# Patient Record
Sex: Female | Born: 1960 | Race: Black or African American | Hispanic: No | Marital: Married | Smoking: Never smoker
Health system: Southern US, Community
[De-identification: ages and names within clinical notes are randomized; demographics above are authoritative.]

## PROBLEM LIST (undated history)

## (undated) DIAGNOSIS — I872 Venous insufficiency (chronic) (peripheral): Secondary | ICD-10-CM

## (undated) DIAGNOSIS — I10 Essential (primary) hypertension: Secondary | ICD-10-CM

## (undated) DIAGNOSIS — K5901 Slow transit constipation: Secondary | ICD-10-CM

## (undated) DIAGNOSIS — K649 Unspecified hemorrhoids: Secondary | ICD-10-CM

## (undated) DIAGNOSIS — E669 Obesity, unspecified: Secondary | ICD-10-CM

## (undated) DIAGNOSIS — N6489 Other specified disorders of breast: Secondary | ICD-10-CM

## (undated) DIAGNOSIS — G5622 Lesion of ulnar nerve, left upper limb: Secondary | ICD-10-CM

## (undated) DIAGNOSIS — R05 Cough: Secondary | ICD-10-CM

## (undated) DIAGNOSIS — R059 Cough, unspecified: Secondary | ICD-10-CM

## (undated) DIAGNOSIS — I8393 Asymptomatic varicose veins of bilateral lower extremities: Secondary | ICD-10-CM

## (undated) HISTORY — DX: Slow transit constipation: K59.01

## (undated) HISTORY — DX: Unspecified hemorrhoids: K64.9

## (undated) HISTORY — DX: Asymptomatic varicose veins of bilateral lower extremities: I83.93

## (undated) HISTORY — PX: TUBAL LIGATION: SHX77

## (undated) HISTORY — DX: Obesity, unspecified: E66.9

## (undated) HISTORY — DX: Cough: R05

## (undated) HISTORY — DX: Venous insufficiency (chronic) (peripheral): I87.2

## (undated) HISTORY — DX: Lesion of ulnar nerve, left upper limb: G56.22

## (undated) HISTORY — DX: Essential (primary) hypertension: I10

## (undated) HISTORY — DX: Other specified disorders of breast: N64.89

## (undated) HISTORY — DX: Cough, unspecified: R05.9

---

## 1998-06-01 ENCOUNTER — Other Ambulatory Visit: Admission: RE | Admit: 1998-06-01 | Discharge: 1998-06-01 | Payer: Self-pay | Admitting: *Deleted

## 1999-10-15 ENCOUNTER — Other Ambulatory Visit: Admission: RE | Admit: 1999-10-15 | Discharge: 1999-10-15 | Payer: Self-pay | Admitting: Obstetrics and Gynecology

## 2001-05-29 ENCOUNTER — Other Ambulatory Visit: Admission: RE | Admit: 2001-05-29 | Discharge: 2001-05-29 | Payer: Self-pay | Admitting: Obstetrics and Gynecology

## 2002-06-14 ENCOUNTER — Other Ambulatory Visit: Admission: RE | Admit: 2002-06-14 | Discharge: 2002-06-14 | Payer: Self-pay | Admitting: Obstetrics and Gynecology

## 2004-03-13 ENCOUNTER — Other Ambulatory Visit: Admission: RE | Admit: 2004-03-13 | Discharge: 2004-03-13 | Payer: Self-pay | Admitting: Obstetrics and Gynecology

## 2004-04-17 ENCOUNTER — Ambulatory Visit (HOSPITAL_COMMUNITY): Admission: RE | Admit: 2004-04-17 | Discharge: 2004-04-17 | Payer: Self-pay | Admitting: Family Medicine

## 2005-06-19 ENCOUNTER — Other Ambulatory Visit: Admission: RE | Admit: 2005-06-19 | Discharge: 2005-06-19 | Payer: Self-pay | Admitting: Obstetrics and Gynecology

## 2006-09-10 ENCOUNTER — Other Ambulatory Visit: Admission: RE | Admit: 2006-09-10 | Discharge: 2006-09-10 | Payer: Self-pay | Admitting: Family Medicine

## 2008-04-11 ENCOUNTER — Other Ambulatory Visit: Admission: RE | Admit: 2008-04-11 | Discharge: 2008-04-11 | Payer: Self-pay | Admitting: Family Medicine

## 2009-05-30 ENCOUNTER — Other Ambulatory Visit: Admission: RE | Admit: 2009-05-30 | Discharge: 2009-05-30 | Payer: Self-pay | Admitting: Family Medicine

## 2009-10-19 ENCOUNTER — Encounter: Admission: RE | Admit: 2009-10-19 | Discharge: 2009-10-19 | Payer: Self-pay | Admitting: *Deleted

## 2010-06-05 ENCOUNTER — Other Ambulatory Visit: Admission: RE | Admit: 2010-06-05 | Discharge: 2010-06-05 | Payer: Self-pay | Admitting: Family Medicine

## 2011-06-10 ENCOUNTER — Other Ambulatory Visit: Payer: Self-pay

## 2011-06-10 ENCOUNTER — Other Ambulatory Visit (HOSPITAL_COMMUNITY)
Admission: RE | Admit: 2011-06-10 | Discharge: 2011-06-10 | Disposition: A | Discharge status: Home / Self Care | Payer: Self-pay | Source: Ambulatory Visit | Attending: Family Medicine | Admitting: Family Medicine

## 2011-06-10 DIAGNOSIS — Z01419 Encounter for gynecological examination (general) (routine) without abnormal findings: Secondary | ICD-10-CM | POA: Insufficient documentation

## 2012-07-21 ENCOUNTER — Other Ambulatory Visit (HOSPITAL_COMMUNITY)
Admission: RE | Admit: 2012-07-21 | Discharge: 2012-07-21 | Disposition: A | Discharge status: Home / Self Care | Payer: BC Managed Care – PPO | Source: Ambulatory Visit | Attending: Family Medicine | Admitting: Family Medicine

## 2012-07-21 ENCOUNTER — Other Ambulatory Visit: Payer: Self-pay | Admitting: Family Medicine

## 2012-07-21 DIAGNOSIS — Z0000 Encounter for general adult medical examination without abnormal findings: Secondary | ICD-10-CM | POA: Insufficient documentation

## 2013-07-23 ENCOUNTER — Other Ambulatory Visit: Payer: Self-pay | Admitting: Family Medicine

## 2013-07-23 ENCOUNTER — Other Ambulatory Visit (HOSPITAL_COMMUNITY)
Admission: RE | Admit: 2013-07-23 | Discharge: 2013-07-23 | Disposition: A | Discharge status: Home / Self Care | Payer: BC Managed Care – PPO | Source: Ambulatory Visit | Attending: Family Medicine | Admitting: Family Medicine

## 2013-07-23 DIAGNOSIS — Z0000 Encounter for general adult medical examination without abnormal findings: Secondary | ICD-10-CM | POA: Diagnosis not present

## 2014-11-24 ENCOUNTER — Other Ambulatory Visit: Payer: Self-pay | Admitting: Gastroenterology

## 2016-08-15 ENCOUNTER — Other Ambulatory Visit: Payer: Self-pay

## 2016-08-15 ENCOUNTER — Other Ambulatory Visit (HOSPITAL_COMMUNITY)
Admission: RE | Admit: 2016-08-15 | Discharge: 2016-08-15 | Disposition: A | Discharge status: Home / Self Care | Payer: BLUE CROSS/BLUE SHIELD | Source: Ambulatory Visit | Attending: Pediatrics | Admitting: Pediatrics

## 2016-08-15 DIAGNOSIS — Z01419 Encounter for gynecological examination (general) (routine) without abnormal findings: Secondary | ICD-10-CM | POA: Diagnosis not present

## 2016-08-16 LAB — CYTOLOGY - PAP: DIAGNOSIS: NEGATIVE

## 2017-06-13 DIAGNOSIS — R05 Cough: Secondary | ICD-10-CM | POA: Diagnosis not present

## 2017-06-13 DIAGNOSIS — J029 Acute pharyngitis, unspecified: Secondary | ICD-10-CM | POA: Diagnosis not present

## 2017-07-16 DIAGNOSIS — K5901 Slow transit constipation: Secondary | ICD-10-CM | POA: Diagnosis not present

## 2017-07-16 DIAGNOSIS — E669 Obesity, unspecified: Secondary | ICD-10-CM | POA: Diagnosis not present

## 2017-07-16 DIAGNOSIS — I10 Essential (primary) hypertension: Secondary | ICD-10-CM | POA: Diagnosis not present

## 2017-07-16 DIAGNOSIS — Z23 Encounter for immunization: Secondary | ICD-10-CM | POA: Diagnosis not present

## 2017-09-15 DIAGNOSIS — Z23 Encounter for immunization: Secondary | ICD-10-CM | POA: Diagnosis not present

## 2017-09-15 DIAGNOSIS — Z0000 Encounter for general adult medical examination without abnormal findings: Secondary | ICD-10-CM | POA: Diagnosis not present

## 2017-10-21 DIAGNOSIS — Z1231 Encounter for screening mammogram for malignant neoplasm of breast: Secondary | ICD-10-CM | POA: Diagnosis not present

## 2017-10-31 DIAGNOSIS — I83813 Varicose veins of bilateral lower extremities with pain: Secondary | ICD-10-CM | POA: Diagnosis not present

## 2018-04-16 DIAGNOSIS — I10 Essential (primary) hypertension: Secondary | ICD-10-CM | POA: Diagnosis not present

## 2018-05-19 DIAGNOSIS — I10 Essential (primary) hypertension: Secondary | ICD-10-CM | POA: Diagnosis not present

## 2018-05-19 DIAGNOSIS — Z6831 Body mass index (BMI) 31.0-31.9, adult: Secondary | ICD-10-CM | POA: Diagnosis not present

## 2018-05-19 DIAGNOSIS — N6489 Other specified disorders of breast: Secondary | ICD-10-CM | POA: Diagnosis not present

## 2018-05-19 DIAGNOSIS — Z23 Encounter for immunization: Secondary | ICD-10-CM | POA: Diagnosis not present

## 2018-09-21 DIAGNOSIS — N6489 Other specified disorders of breast: Secondary | ICD-10-CM | POA: Diagnosis not present

## 2018-09-21 DIAGNOSIS — I10 Essential (primary) hypertension: Secondary | ICD-10-CM | POA: Diagnosis not present

## 2018-10-06 ENCOUNTER — Other Ambulatory Visit: Payer: Self-pay

## 2018-10-06 DIAGNOSIS — I872 Venous insufficiency (chronic) (peripheral): Secondary | ICD-10-CM

## 2018-10-07 DIAGNOSIS — I10 Essential (primary) hypertension: Secondary | ICD-10-CM | POA: Diagnosis not present

## 2018-10-21 DIAGNOSIS — Z0000 Encounter for general adult medical examination without abnormal findings: Secondary | ICD-10-CM | POA: Diagnosis not present

## 2018-10-21 DIAGNOSIS — E669 Obesity, unspecified: Secondary | ICD-10-CM | POA: Diagnosis not present

## 2018-10-21 DIAGNOSIS — I83813 Varicose veins of bilateral lower extremities with pain: Secondary | ICD-10-CM | POA: Diagnosis not present

## 2018-10-21 DIAGNOSIS — I10 Essential (primary) hypertension: Secondary | ICD-10-CM | POA: Diagnosis not present

## 2018-10-26 DIAGNOSIS — Z1231 Encounter for screening mammogram for malignant neoplasm of breast: Secondary | ICD-10-CM | POA: Diagnosis not present

## 2018-11-03 DIAGNOSIS — J069 Acute upper respiratory infection, unspecified: Secondary | ICD-10-CM | POA: Diagnosis not present

## 2018-11-03 DIAGNOSIS — I83813 Varicose veins of bilateral lower extremities with pain: Secondary | ICD-10-CM | POA: Diagnosis not present

## 2018-11-03 DIAGNOSIS — I10 Essential (primary) hypertension: Secondary | ICD-10-CM | POA: Diagnosis not present

## 2018-11-11 ENCOUNTER — Encounter: Payer: Self-pay | Admitting: Vascular Surgery

## 2018-11-16 ENCOUNTER — Encounter: Payer: Self-pay | Admitting: Vascular Surgery

## 2018-11-17 ENCOUNTER — Encounter (HOSPITAL_COMMUNITY): Payer: BLUE CROSS/BLUE SHIELD

## 2018-11-17 ENCOUNTER — Encounter: Payer: BLUE CROSS/BLUE SHIELD | Admitting: Vascular Surgery

## 2019-01-12 ENCOUNTER — Ambulatory Visit (HOSPITAL_COMMUNITY)
Admission: RE | Admit: 2019-01-12 | Discharge: 2019-01-12 | Disposition: A | Discharge status: Home / Self Care | Payer: BLUE CROSS/BLUE SHIELD | Source: Ambulatory Visit | Attending: Vascular Surgery | Admitting: Vascular Surgery

## 2019-01-12 ENCOUNTER — Other Ambulatory Visit: Payer: Self-pay

## 2019-01-12 ENCOUNTER — Ambulatory Visit (INDEPENDENT_AMBULATORY_CARE_PROVIDER_SITE_OTHER): Payer: BLUE CROSS/BLUE SHIELD | Admitting: Vascular Surgery

## 2019-01-12 ENCOUNTER — Encounter: Payer: Self-pay | Admitting: Vascular Surgery

## 2019-01-12 VITALS — BP 126/72 | HR 102 | Temp 97.9°F | Resp 14 | Ht 64.0 in | Wt 177.8 lb

## 2019-01-12 DIAGNOSIS — M25561 Pain in right knee: Secondary | ICD-10-CM | POA: Diagnosis not present

## 2019-01-12 DIAGNOSIS — I872 Venous insufficiency (chronic) (peripheral): Secondary | ICD-10-CM | POA: Insufficient documentation

## 2019-01-12 NOTE — Progress Notes (Signed)
[facility] [place]  Patient name: Morgan Mcintyre MRN: [id number] DOB: [birth year] Sex: female  REASON FOR CONSULT: Discuss pain in her right leg  HPI: Morgan Mcintyre is a 58 y.o. female, who is here today for discussion of pain in her right leg.  She is been told in the past that this may be venous hypertension.  She denies any right or left leg swelling.  She reports tenderness and pain over her pretibial area on the right.  This can occur at any time and is not related to exercise.  She reports that she has worn compression at night with some improvement in her symptoms.  No history of DVT and no history of venous varicosities.  Does have a history of a few telangiectasia on her thighs  Past Medical History:  Diagnosis Date  . Breast asymmetry   . Cough   . Cubital tunnel syndrome, left   . Hemorrhoids   . Hypertension   . Obesity   . Slow transit constipation   . Varicose veins of both lower extremities   . Venous (peripheral) insufficiency     History reviewed. No pertinent family history.  SOCIAL HISTORY: Social History   Socioeconomic History  . Marital status: Married    Spouse name: Not on file  . Number of children: 2  . Years of education: Not on file  . Highest education level: Not on file  Occupational History  . Not on file  Social Needs  . Financial resource strain: Not on file  . Food insecurity:    Worry: Not on file    Inability: Not on file  . Transportation needs:    Medical: Not on file    Non-medical: Not on file  Tobacco Use  . Smoking status: Never Smoker  . Smokeless tobacco: Never Used  Substance and Sexual Activity  . Alcohol use: Not on file  . Drug use: Not on file  . Sexual activity: Not on file  Lifestyle  . Physical activity:    Days per week: Not on file    Minutes per session: Not on file  . Stress: Not on file  Relationships  . Social connections:    Talks on  phone: Not on file    Gets together: Not on file    Attends religious service: Not on file    Active member of club or organization: Not on file    Attends meetings of clubs or organizations: Not on file    Relationship status: Not on file  . Intimate partner violence:    Fear of current or ex partner: Not on file    Emotionally abused: Not on file    Physically abused: Not on file    Forced sexual activity: Not on file  Other Topics Concern  . Not on file  Social History Narrative  . Not on file    Allergies  Allergen Reactions  . Benazepril     Angioedema     Current Outpatient Medications  Medication Sig Dispense Refill  . amLODipine (NORVASC) 10 MG tablet     . cholecalciferol (VITAMIN D3) 25 MCG (1000 UT) tablet Take 1,000 Units by mouth daily.    . cycloSPORINE (RESTASIS) 0.05 % ophthalmic emulsion 1 drop 2 (two) times daily. 1 drop into affected eye Ophthalmic    . hydrochlorothiazide (HYDRODIURIL) 25 MG tablet     . Hydrocortisone Acetate (ANUSOL HC-1 EX) Apply topically.    Marland Kitchen  Multiple Vitamin (MULTIVITAMIN) tablet Take 1 tablet by mouth daily.    . Probiotic Product (PROBIOTIC-10 ULTIMATE PO) Take by mouth.     No current facility-administered medications for this visit.     REVIEW OF SYSTEMS:  [X]  denotes positive finding, [ ]  denotes negative finding Cardiac  Comments:  Chest pain or chest pressure:    Shortness of breath upon exertion:    Short of breath when lying flat:    Irregular heart rhythm:        Vascular    Pain in calf, thigh, or hip brought on by ambulation: x   Pain in feet at night that wakes you up from your sleep:     Blood clot in your veins:    Leg swelling:         Pulmonary    Oxygen at home:    Productive cough:     Wheezing:         Neurologic    Sudden weakness in arms or legs:     Sudden numbness in arms or legs:     Sudden onset of difficulty speaking or slurred speech:    Temporary loss of vision in one eye:      Problems with dizziness:         Gastrointestinal    Blood in stool:     Vomited blood:         Genitourinary    Burning when urinating:     Blood in urine:        Psychiatric    Major depression:         Hematologic    Bleeding problems:    Problems with blood clotting too easily:        Skin    Rashes or ulcers:        Constitutional    Fever or chills:      PHYSICAL EXAM: Vitals:   01/12/19 1256  BP: 126/72  Pulse: (!) 102  Resp: 14  Temp: 97.9 F (36.6 C)  TempSrc: Oral  SpO2: 98%  Weight: 177 lb 12.8 oz (80.6 kg)  Height: 5\' 4"  (1.626 m)    GENERAL: The patient is a well-nourished female, in no acute distress. The vital signs are documented above. CARDIOVASCULAR: 2+ radial and 2+ dorsalis pedis pulses bilaterally.  No evidence of venous varicosities.  Few scattered telangiectasia on her medial and lateral thighs PULMONARY: There is good air exchange  ABDOMEN: Soft and non-tender  MUSCULOSKELETAL: There are no major deformities or cyanosis. NEUROLOGIC: No focal weakness or paresthesias are detected. SKIN: There are no ulcers or rashes noted.  No evidence of venous hypertension with hemosiderin or other issues PSYCHIATRIC: The patient has a normal affect.  DATA:  Lower extremity duplex shows no evidence of DVT and no evidence of significant venous reflux  MEDICAL ISSUES: I discussed these findings with the patient.  I am not sure as to the cause of her discomfort.  I do not see any evidence of physical findings for venous hypertension and certainly does not have any evidence of arterial insufficiency.  Her main issue is pain over the pretibial area that appears to be more musculoskeletal.  She was reassured assured with this discussion and will see us again on an as-needed basis   Larina Earthlyodd F. Daivd Fredericksen, MD [facility] Office Tel [phone] Pager [phone]

## 2019-01-28 DIAGNOSIS — I10 Essential (primary) hypertension: Secondary | ICD-10-CM | POA: Diagnosis not present

## 2019-01-28 DIAGNOSIS — E669 Obesity, unspecified: Secondary | ICD-10-CM | POA: Diagnosis not present

## 2019-02-05 DIAGNOSIS — H6121 Impacted cerumen, right ear: Secondary | ICD-10-CM | POA: Diagnosis not present

## 2019-02-05 DIAGNOSIS — H9311 Tinnitus, right ear: Secondary | ICD-10-CM | POA: Diagnosis not present

## 2019-02-10 DIAGNOSIS — H9311 Tinnitus, right ear: Secondary | ICD-10-CM | POA: Diagnosis not present

## 2019-03-16 DIAGNOSIS — H6983 Other specified disorders of Eustachian tube, bilateral: Secondary | ICD-10-CM | POA: Diagnosis not present

## 2019-03-16 DIAGNOSIS — H9313 Tinnitus, bilateral: Secondary | ICD-10-CM | POA: Diagnosis not present

## 2019-03-16 DIAGNOSIS — H93293 Other abnormal auditory perceptions, bilateral: Secondary | ICD-10-CM | POA: Diagnosis not present

## 2019-04-27 DIAGNOSIS — Z23 Encounter for immunization: Secondary | ICD-10-CM | POA: Diagnosis not present

## 2019-05-03 DIAGNOSIS — I10 Essential (primary) hypertension: Secondary | ICD-10-CM | POA: Diagnosis not present

## 2019-09-27 ENCOUNTER — Ambulatory Visit: Payer: BLUE CROSS/BLUE SHIELD | Attending: Internal Medicine

## 2019-09-30 ENCOUNTER — Ambulatory Visit: Payer: BLUE CROSS/BLUE SHIELD

## 2019-10-04 ENCOUNTER — Ambulatory Visit: Payer: BLUE CROSS/BLUE SHIELD | Attending: Family

## 2019-10-04 DIAGNOSIS — Z23 Encounter for immunization: Secondary | ICD-10-CM | POA: Insufficient documentation

## 2019-10-04 NOTE — Progress Notes (Signed)
   Covid-19 Vaccination Clinic  Name:  Morgan Mcintyre    MRN: [id number] DOB: Nov 20, 1960  10/04/2019  [name] was observed post Covid-19 immunization for 15 minutes without incidence. She was provided with Vaccine Information Sheet and instruction to access the V-Safe system.   [name] was instructed to call 911 with any severe reactions post vaccine: Marland Kitchen Difficulty breathing  . Swelling of your face and throat  . A fast heartbeat  . A bad rash all over your body  . Dizziness and weakness    Immunizations Administered    Name Date Dose VIS Date Route   Moderna COVID-19 Vaccine 10/04/2019  2:12 PM 0.5 mL 07/13/2019 Intramuscular   Manufacturer: Moderna   Lot: [id number]   NDC: 63016-010-93

## 2019-11-01 DIAGNOSIS — K5901 Slow transit constipation: Secondary | ICD-10-CM | POA: Diagnosis not present

## 2019-11-01 DIAGNOSIS — I10 Essential (primary) hypertension: Secondary | ICD-10-CM | POA: Diagnosis not present

## 2019-11-01 DIAGNOSIS — Z0000 Encounter for general adult medical examination without abnormal findings: Secondary | ICD-10-CM | POA: Diagnosis not present

## 2019-11-08 ENCOUNTER — Other Ambulatory Visit: Payer: Self-pay | Admitting: Family Medicine

## 2019-11-08 DIAGNOSIS — E7800 Pure hypercholesterolemia, unspecified: Secondary | ICD-10-CM

## 2019-11-09 ENCOUNTER — Ambulatory Visit: Payer: BLUE CROSS/BLUE SHIELD | Attending: Family

## 2019-11-09 DIAGNOSIS — Z23 Encounter for immunization: Secondary | ICD-10-CM

## 2019-11-09 NOTE — Progress Notes (Signed)
   Covid-19 Vaccination Clinic  Name:  Morgan Mcintyre    MRN: [id number] DOB: [birth year]  11/09/2019  Ms. Morgan Mcintyre was observed post Covid-19 immunization for 15 minutes without incident. She was provided with Vaccine Information Sheet and instruction to access the V-Safe system.   Ms. Morgan Mcintyre was instructed to call 911 with any severe reactions post vaccine: Marland Kitchen Difficulty breathing  . Swelling of face and throat  . A fast heartbeat  . A bad rash all over body  . Dizziness and weakness   Immunizations Administered    Name Date Dose VIS Date Route   Moderna COVID-19 Vaccine 11/09/2019  3:55 PM 0.5 mL 07/13/2019 Intramuscular   Manufacturer: Moderna   Lot: [id number]   NDC: 09030-149-96

## 2019-11-26 ENCOUNTER — Other Ambulatory Visit: Payer: BLUE CROSS/BLUE SHIELD

## 2019-12-17 ENCOUNTER — Ambulatory Visit
Admission: RE | Admit: 2019-12-17 | Discharge: 2019-12-17 | Disposition: A | Discharge status: Home / Self Care | Payer: No Typology Code available for payment source | Source: Ambulatory Visit | Attending: Family Medicine | Admitting: Family Medicine

## 2019-12-17 DIAGNOSIS — E7800 Pure hypercholesterolemia, unspecified: Secondary | ICD-10-CM

## 2019-12-24 DIAGNOSIS — Z1231 Encounter for screening mammogram for malignant neoplasm of breast: Secondary | ICD-10-CM | POA: Diagnosis not present

## 2020-01-03 DIAGNOSIS — H25813 Combined forms of age-related cataract, bilateral: Secondary | ICD-10-CM | POA: Diagnosis not present

## 2020-01-03 DIAGNOSIS — H18513 Endothelial corneal dystrophy, bilateral: Secondary | ICD-10-CM | POA: Diagnosis not present

## 2020-01-03 DIAGNOSIS — H16223 Keratoconjunctivitis sicca, not specified as Sjogren's, bilateral: Secondary | ICD-10-CM | POA: Diagnosis not present

## 2020-02-17 DIAGNOSIS — H2512 Age-related nuclear cataract, left eye: Secondary | ICD-10-CM | POA: Diagnosis not present

## 2020-02-28 DIAGNOSIS — H2511 Age-related nuclear cataract, right eye: Secondary | ICD-10-CM | POA: Diagnosis not present

## 2020-03-02 DIAGNOSIS — H2511 Age-related nuclear cataract, right eye: Secondary | ICD-10-CM | POA: Diagnosis not present

## 2020-08-22 ENCOUNTER — Other Ambulatory Visit: Payer: BLUE CROSS/BLUE SHIELD

## 2020-08-25 ENCOUNTER — Other Ambulatory Visit: Payer: BLUE CROSS/BLUE SHIELD

## 2021-10-24 ENCOUNTER — Encounter (HOSPITAL_BASED_OUTPATIENT_CLINIC_OR_DEPARTMENT_OTHER): Payer: Self-pay | Admitting: *Deleted

## 2021-10-24 ENCOUNTER — Other Ambulatory Visit: Payer: Self-pay

## 2021-10-29 ENCOUNTER — Encounter (HOSPITAL_BASED_OUTPATIENT_CLINIC_OR_DEPARTMENT_OTHER)
Admission: RE | Admit: 2021-10-29 | Discharge: 2021-10-29 | Disposition: A | Discharge status: Home / Self Care | Payer: BC Managed Care – PPO | Source: Ambulatory Visit | Attending: Ophthalmology | Admitting: Ophthalmology

## 2021-10-29 DIAGNOSIS — Z01812 Encounter for preprocedural laboratory examination: Secondary | ICD-10-CM | POA: Insufficient documentation

## 2021-10-29 DIAGNOSIS — I10 Essential (primary) hypertension: Secondary | ICD-10-CM | POA: Diagnosis not present

## 2021-10-29 DIAGNOSIS — H5010 Unspecified exotropia: Secondary | ICD-10-CM | POA: Diagnosis present

## 2021-10-29 LAB — BASIC METABOLIC PANEL
Anion gap: 8 (ref 5–15)
BUN: 11 mg/dL (ref 8–23)
CO2: 27 mmol/L (ref 22–32)
Calcium: 9.9 mg/dL (ref 8.9–10.3)
Chloride: 103 mmol/L (ref 98–111)
Creatinine, Ser: 0.64 mg/dL (ref 0.44–1.00)
GFR, Estimated: >60 mL/min (ref >60)
Glucose, Bld: 95 mg/dL (ref 70–99)
Potassium: 4.2 mmol/L (ref 3.5–5.1)
Sodium: 138 mmol/L (ref 135–145)

## 2021-10-31 ENCOUNTER — Ambulatory Visit: Payer: Self-pay | Admitting: Ophthalmology

## 2021-10-31 NOTE — H&P (Signed)
?  Date of examination:  10/24/21 ? 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 ?Pertinent past medical history:  ?Past Medical History:  ?Diagnosis Date  ? Breast asymmetry   ? Cough   ? Cubital tunnel syndrome, left   ? Hemorrhoids   ? Hypertension   ? Obesity   ? Slow transit constipation   ? Varicose veins of both lower extremities   ? Venous (peripheral) insufficiency   ? ? ?Pertinent ocular history:  Patient reports years of intermittnet outward turning of her right eye that is becoming more and more frequent. She notices it during conversation and is also quite bothered by the social implications of not making eye contact. Patient has TORIC IOLS in both eyes with good visual results. ? ?Pertinent family history: No family history on file. ? ?General:  Healthy appearing patient in no distress.   ? ?Eyes:   ? Acuity OD 20/25+  OS 20/20-  [place] ? External: Within normal limits    ? Anterior segment: Within normal limits    ? Motility:   30pd RX(T) grade 4 with small V pattern ? ?Impression:60yo female with RX(T) disrupting binocular vision and social communication ability. ? ?Plan: Eye muscle surgery both eyes ? ?M. Lenox Ahr, MD ? ?

## 2021-10-31 NOTE — H&P (View-Only) (Signed)
?  Date of examination:  10/24/21 ? 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 ?Pertinent past medical history:  ?Past Medical History:  ?Diagnosis Date  ? Breast asymmetry   ? Cough   ? Cubital tunnel syndrome, left   ? Hemorrhoids   ? Hypertension   ? Obesity   ? Slow transit constipation   ? Varicose veins of both lower extremities   ? Venous (peripheral) insufficiency   ? ? ?Pertinent ocular history:  Patient reports years of intermittnet outward turning of her right eye that is becoming more and more frequent. She notices it during conversation and is also quite bothered by the social implications of not making eye contact. Patient has TORIC IOLS in both eyes with good visual results. ? ?Pertinent family history: No family history on file. ? ?General:  Healthy appearing patient in no distress.   ? ?Eyes:   ? Acuity OD 20/25+  OS 20/20-  [place] ? External: Within normal limits    ? Anterior segment: Within normal limits    ? Motility:   30pd RX(T) grade 4 with small V pattern ? ?Impression:60yo female with RX(T) disrupting binocular vision and social communication ability. ? ?Plan: Eye muscle surgery both eyes ? ?M. Lenox Ahr, MD ? ?

## 2021-11-01 ENCOUNTER — Ambulatory Visit (HOSPITAL_BASED_OUTPATIENT_CLINIC_OR_DEPARTMENT_OTHER): Payer: BC Managed Care – PPO | Admitting: Anesthesiology

## 2021-11-01 ENCOUNTER — Encounter (HOSPITAL_BASED_OUTPATIENT_CLINIC_OR_DEPARTMENT_OTHER): Payer: Self-pay | Admitting: Ophthalmology

## 2021-11-01 ENCOUNTER — Ambulatory Visit (HOSPITAL_BASED_OUTPATIENT_CLINIC_OR_DEPARTMENT_OTHER)
Admission: RE | Admit: 2021-11-01 | Discharge: 2021-11-01 | Disposition: A | Discharge status: Home / Self Care | Payer: BC Managed Care – PPO | Attending: Ophthalmology | Admitting: Ophthalmology

## 2021-11-01 ENCOUNTER — Encounter (HOSPITAL_BASED_OUTPATIENT_CLINIC_OR_DEPARTMENT_OTHER)
Admission: RE | Disposition: A | Discharge status: Home / Self Care | Payer: Self-pay | Source: Home / Self Care | Attending: Ophthalmology

## 2021-11-01 ENCOUNTER — Other Ambulatory Visit: Payer: Self-pay

## 2021-11-01 DIAGNOSIS — Z79899 Other long term (current) drug therapy: Secondary | ICD-10-CM

## 2021-11-01 DIAGNOSIS — H5010 Unspecified exotropia: Secondary | ICD-10-CM | POA: Diagnosis not present

## 2021-11-01 DIAGNOSIS — I10 Essential (primary) hypertension: Secondary | ICD-10-CM | POA: Insufficient documentation

## 2021-11-01 HISTORY — PX: STRABISMUS SURGERY: SHX218

## 2021-11-01 SURGERY — STRABISMUS SURGERY, BILATERAL
Anesthesia: General | Site: Eye | Laterality: Bilateral

## 2021-11-01 MED ORDER — ONDANSETRON HCL 4 MG/2ML IJ SOLN
INTRAMUSCULAR | Status: DC | PRN
  Administered 2021-11-01: 4 mg via INTRAVENOUS

## 2021-11-01 MED ORDER — BUPIVACAINE HCL (PF) 0.5 % IJ SOLN
INTRAMUSCULAR | Status: AC
  Filled 2021-11-01: qty 30

## 2021-11-01 MED ORDER — PHENYLEPHRINE HCL 2.5 % OP SOLN
OPHTHALMIC | Status: AC
  Filled 2021-11-01: qty 2

## 2021-11-01 MED ORDER — LIDOCAINE HCL (CARDIAC) PF 100 MG/5ML IV SOSY
PREFILLED_SYRINGE | INTRAVENOUS | Status: DC | PRN
  Administered 2021-11-01: 60 mg via INTRATRACHEAL

## 2021-11-01 MED ORDER — LACTATED RINGERS IV SOLN: INTRAVENOUS | Status: DC

## 2021-11-01 MED ORDER — TOBRAMYCIN-DEXAMETHASONE 0.3-0.1 % OP SUSP: 1.0000 [drp] | Freq: Four times a day (QID) | OPHTHALMIC | 0 refills | Status: AC

## 2021-11-01 MED ORDER — PHENYLEPHRINE HCL (PRESSORS) 10 MG/ML IV SOLN
INTRAVENOUS | Status: DC | PRN
  Administered 2021-11-01 (×3): 80 ug via INTRAVENOUS

## 2021-11-01 MED ORDER — ONDANSETRON HCL 4 MG/2ML IJ SOLN
INTRAMUSCULAR | Status: AC
  Filled 2021-11-01: qty 2

## 2021-11-01 MED ORDER — TOBRAMYCIN-DEXAMETHASONE 0.3-0.1 % OP SUSP
OPHTHALMIC | Status: AC
  Filled 2021-11-01: qty 2.5

## 2021-11-01 MED ORDER — ONDANSETRON HCL 4 MG/2ML IJ SOLN: 4.0000 mg | Freq: Once | INTRAMUSCULAR | Status: DC | PRN

## 2021-11-01 MED ORDER — MIDAZOLAM HCL 5 MG/5ML IJ SOLN
INTRAMUSCULAR | Status: DC | PRN
  Administered 2021-11-01: 2 mg via INTRAVENOUS

## 2021-11-01 MED ORDER — FENTANYL CITRATE (PF) 100 MCG/2ML IJ SOLN: 25.0000 ug | INTRAMUSCULAR | Status: DC | PRN

## 2021-11-01 MED ORDER — MIDAZOLAM HCL 2 MG/2ML IJ SOLN
INTRAMUSCULAR | Status: AC
  Filled 2021-11-01: qty 2

## 2021-11-01 MED ORDER — TOBRAMYCIN-DEXAMETHASONE 0.3-0.1 % OP SUSP
OPHTHALMIC | Status: DC | PRN
Start: 2021-11-01 — End: 2021-11-01
  Administered 2021-11-01: 2 [drp] via OPHTHALMIC

## 2021-11-01 MED ORDER — FENTANYL CITRATE (PF) 100 MCG/2ML IJ SOLN
INTRAMUSCULAR | Status: AC
  Filled 2021-11-01: qty 2

## 2021-11-01 MED ORDER — ACETAMINOPHEN 325 MG PO TABS: 325.0000 mg | ORAL_TABLET | ORAL | Status: DC | PRN

## 2021-11-01 MED ORDER — ACETAMINOPHEN 160 MG/5ML PO SOLN: 325.0000 mg | ORAL | Status: DC | PRN

## 2021-11-01 MED ORDER — KETOROLAC TROMETHAMINE 30 MG/ML IJ SOLN
INTRAMUSCULAR | Status: DC | PRN
  Administered 2021-11-01: 30 mg via INTRAVENOUS

## 2021-11-01 MED ORDER — EPHEDRINE SULFATE (PRESSORS) 50 MG/ML IJ SOLN
INTRAMUSCULAR | Status: DC | PRN
  Administered 2021-11-01: 10 mg via INTRAVENOUS

## 2021-11-01 MED ORDER — OXYCODONE HCL 5 MG PO TABS: 5.0000 mg | ORAL_TABLET | Freq: Once | ORAL | Status: DC | PRN

## 2021-11-01 MED ORDER — BSS IO SOLN
INTRAOCULAR | Status: AC
  Filled 2021-11-01: qty 15

## 2021-11-01 MED ORDER — BSS IO SOLN
INTRAOCULAR | Status: DC | PRN
  Administered 2021-11-01: 15 mL

## 2021-11-01 MED ORDER — BUPIVACAINE HCL (PF) 0.5 % IJ SOLN
INTRAMUSCULAR | Status: DC | PRN
  Administered 2021-11-01: 3 mL

## 2021-11-01 MED ORDER — NEOMYCIN-POLYMYXIN-DEXAMETH 3.5-10000-0.1 OP OINT
TOPICAL_OINTMENT | OPHTHALMIC | Status: DC | PRN
  Administered 2021-11-01: 1 via OPHTHALMIC

## 2021-11-01 MED ORDER — OXYCODONE HCL 5 MG/5ML PO SOLN: 5.0000 mg | Freq: Once | ORAL | Status: DC | PRN

## 2021-11-01 MED ORDER — CYCLOSPORINE 0.05 % OP EMUL
1.0000 [drp] | Freq: Two times a day (BID) | OPHTHALMIC | 1 refills | Status: AC
Start: 2021-11-07 — End: ?

## 2021-11-01 MED ORDER — DEXAMETHASONE SODIUM PHOSPHATE 10 MG/ML IJ SOLN
INTRAMUSCULAR | Status: DC | PRN
  Administered 2021-11-01: 10 mg via INTRAVENOUS

## 2021-11-01 MED ORDER — PROPOFOL 10 MG/ML IV BOLUS
INTRAVENOUS | Status: AC
  Filled 2021-11-01: qty 20

## 2021-11-01 MED ORDER — KETOROLAC TROMETHAMINE 30 MG/ML IJ SOLN
INTRAMUSCULAR | Status: AC
  Filled 2021-11-01: qty 1

## 2021-11-01 MED ORDER — ACETAMINOPHEN 500 MG PO TABS
1000.0000 mg | ORAL_TABLET | Freq: Once | ORAL | Status: AC
  Administered 2021-11-01: 1000 mg via ORAL

## 2021-11-01 MED ORDER — PROPOFOL 10 MG/ML IV BOLUS
INTRAVENOUS | Status: DC | PRN
  Administered 2021-11-01: 140 mg via INTRAVENOUS
  Administered 2021-11-01: 20 mg via INTRAVENOUS

## 2021-11-01 MED ORDER — MEPERIDINE HCL 25 MG/ML IJ SOLN: 6.2500 mg | INTRAMUSCULAR | Status: DC | PRN

## 2021-11-01 MED ORDER — NEOMYCIN-POLYMYXIN-DEXAMETH 3.5-10000-0.1 OP OINT
TOPICAL_OINTMENT | OPHTHALMIC | Status: AC
  Filled 2021-11-01: qty 3.5

## 2021-11-01 MED ORDER — PHENYLEPHRINE HCL 2.5 % OP SOLN
1.0000 [drp] | Freq: Once | OPHTHALMIC | Status: AC
  Administered 2021-11-01: 1 [drp] via OPHTHALMIC

## 2021-11-01 MED ORDER — FENTANYL CITRATE (PF) 100 MCG/2ML IJ SOLN
INTRAMUSCULAR | Status: DC | PRN
  Administered 2021-11-01: 50 ug via INTRAVENOUS
  Administered 2021-11-01: 25 ug via INTRAVENOUS

## 2021-11-01 SURGICAL SUPPLY — 29 items
APL SRG 3 HI ABS STRL LF PLS (MISCELLANEOUS) ×1
APL SWBSTK 6 STRL LF DISP (MISCELLANEOUS) ×4
APPLICATOR COTTON TIP 6 STRL (MISCELLANEOUS) ×4 IMPLANT
APPLICATOR COTTON TIP 6IN STRL (MISCELLANEOUS) ×8
APPLICATOR DR MATTHEWS STRL (MISCELLANEOUS) ×2 IMPLANT
BNDG EYE OVAL (GAUZE/BANDAGES/DRESSINGS) IMPLANT
CAUTERY EYE LOW TEMP 1300F FIN (OPHTHALMIC RELATED) IMPLANT
CORD BIPOLAR FORCEPS 12FT (ELECTRODE) ×2 IMPLANT
COVER BACK TABLE 60X90IN (DRAPES) ×2 IMPLANT
COVER MAYO STAND STRL (DRAPES) ×2 IMPLANT
DRAPE SURG 17X23 STRL (DRAPES) IMPLANT
DRAPE U-SHAPE 76X120 STRL (DRAPES) ×2 IMPLANT
GLOVE SURG ENC MOIS LTX SZ6.5 (GLOVE) ×2 IMPLANT
GLOVE SURG ENC MOIS LTX SZ7 (GLOVE) ×2 IMPLANT
GOWN STRL REUS W/ TWL LRG LVL3 (GOWN DISPOSABLE) ×2 IMPLANT
GOWN STRL REUS W/TWL LRG LVL3 (GOWN DISPOSABLE) ×4
NS IRRIG 1000ML POUR BTL (IV SOLUTION) ×2 IMPLANT
PACK BASIN DAY SURGERY FS (CUSTOM PROCEDURE TRAY) ×2 IMPLANT
SHEILD EYE MED CORNL SHD 22X21 (OPHTHALMIC RELATED)
SHIELD EYE MED CORNL SHD 22X21 (OPHTHALMIC RELATED) IMPLANT
SPEAR EYE SURG WECK-CEL (MISCELLANEOUS) ×2 IMPLANT
STRIP CLOSURE SKIN 1/4X4 (GAUZE/BANDAGES/DRESSINGS) IMPLANT
SUT CHROMIC 7 0 TG140 8 (SUTURE) ×2 IMPLANT
SUT VICRYL 6 0 S 28 (SUTURE) ×2 IMPLANT
SUT VICRYL ABS 6-0 S29 18IN (SUTURE) IMPLANT
SYR 10ML LL (SYRINGE) ×2 IMPLANT
SYR 3ML 23GX1 SAFETY (SYRINGE) ×4 IMPLANT
TOWEL GREEN STERILE FF (TOWEL DISPOSABLE) ×2 IMPLANT
TRAY DSU PREP LF (CUSTOM PROCEDURE TRAY) ×2 IMPLANT

## 2021-11-01 NOTE — Op Note (Signed)
11/01/2021 ? ?9:28 AM ? ?PATIENT:  Morgan Mcintyre  61 y.o. female ? ?PRE-OPERATIVE DIAGNOSIS:  Exotropia with V-pattern ? ?POST-OPERATIVE DIAGNOSIS:  Exotropia with V-pattern ? ?PROCEDURE:  Lateral rectus muscle recession 54mm both with upshift ? ?SURGEON:  Annita Brod, M.D.  ? ?ANESTHESIA: General LMA and local subTenons bupivicaine ? ?COMPLICATIONS: None immediate ? ?DESCRIPTION OF PROCEDURE: The patient was taken to the operating room where She was identified by me. General anesthesia was induced without difficulty after placement of appropriate monitors. The patient was prepped and draped in the usual sterile ophthalmic fashion. Maxitrol eye ointment was placed in both eyes for corneal protection during the case. ? ?A lid speculum was placed in the right eye. Forced ductions were unremarkable. Through an inferotemporal fornix incision through conjunctiva and Tenon's fascia, the right lateral rectus muscle was engaged on a series of muscle hooks and cleared of its fascial attachments. The tendon was secured with a double-armed 6-0 Vicryl suture with a double locking bite at each border of the muscle, 1 mm from the insertion. The muscle was disinserted, and was reattached to sclera at a measured distance of 7 millimeters posterior to the original insertion, with 1/2 muscle-width upshift, using direct scleral passes in crossed swords fashion.  The suture ends were tied securely after the position of the muscle had been checked and found to be accurate. 1.67mL of bupivacaine 0.5% was diffused into the sub-Tenons space for perioperative anesthesia. Conjunctiva was closed with 2 7-0 Chromic sutures. ? ?The speculum was transferred to the left eye, where an identical procedure was performed, again effecting a 7 millimeters recession with 1/2 muscle-width upshift of the lateral rectus muscle. ? ?Two drops of dilute betadine were placed on each eye and rinsed after ten seconds; Maxitrol ointment was placed in each  eye. The patient was awakened without difficulty and taken to the recovery room in stable condition, having suffered no intraoperative or immediate postoperative complications. ? ?M. Lenox Ahr M.D. ? ?

## 2021-11-01 NOTE — Anesthesia Procedure Notes (Signed)
Procedure Name: LMA Insertion ?Date/Time: 11/01/2021 8:44 AM ?Performed by: Glory Buff, CRNA ?Pre-anesthesia Checklist: Patient identified, Emergency Drugs available, Suction available and Patient being monitored ?Patient Re-evaluated:Patient Re-evaluated prior to induction ?Oxygen Delivery Method: Circle system utilized ?Preoxygenation: Pre-oxygenation with 100% oxygen ?Induction Type: IV induction ?LMA: LMA inserted ?LMA Size: 4.0 ?Number of attempts: 1 ?Placement Confirmation: positive ETCO2 ?Tube secured with: Tape ?Dental Injury: Teeth and Oropharynx as per pre-operative assessment  ? ? ? ? ?

## 2021-11-01 NOTE — Anesthesia Preprocedure Evaluation (Addendum)
Anesthesia Evaluation  ?Patient identified by MRN, date of birth, ID band ?Patient awake ? ? ? ?Reviewed: ?Allergy & Precautions, NPO status , Patient's Chart, lab work & pertinent test results ? ?History of Anesthesia Complications ?Negative for: history of anesthetic complications ? ?Airway ?Mallampati: II ? ?TM Distance: >3 FB ?Neck ROM: Full ? ? ? Dental ? ?(+) Dental Advisory Given, Teeth Intact ?  ?Pulmonary ?neg pulmonary ROS,  ?  ?Pulmonary exam normal ? ? ? ? ? ? ? Cardiovascular ?hypertension, Pt. on medications ?Normal cardiovascular exam ? ? ?  ?Neuro/Psych ?negative neurological ROS ?   ? GI/Hepatic ?negative GI ROS, Neg liver ROS,   ?Endo/Other  ?negative endocrine ROS ? Renal/GU ?negative Renal ROS  ?negative genitourinary ?  ?Musculoskeletal ?negative musculoskeletal ROS ?(+)  ? Abdominal ?  ?Peds ? Hematology ?negative hematology ROS ?(+)   ?Anesthesia Other Findings ? ? Reproductive/Obstetrics ? ?  ? ? ? ? ? ? ? ? ? ? ? ? ? ?  ?  ? ? ? ? ? ? ?Anesthesia Physical ?Anesthesia Plan ? ?ASA: 2 ? ?Anesthesia Plan: General  ? ?Post-op Pain Management: Toradol IV (intra-op)* and Tylenol PO (pre-op)*  ? ?Induction: Intravenous ? ?PONV Risk Score and Plan: 3 and Ondansetron, Dexamethasone, Midazolam and Treatment may vary due to age or medical condition ? ?Airway Management Planned: LMA ? ?Additional Equipment: None ? ?Intra-op Plan:  ? ?Post-operative Plan: Extubation in OR ? ?Informed Consent: I have reviewed the patients History and Physical, chart, labs and discussed the procedure including the risks, benefits and alternatives for the proposed anesthesia with the patient or authorized representative who has indicated his/her understanding and acceptance.  ? ? ? ?Dental advisory given ? ?Plan Discussed with:  ? ?Anesthesia Plan Comments:   ? ? ? ? ? ?Anesthesia Quick Evaluation ? ?

## 2021-11-01 NOTE — Discharge Instructions (Addendum)
Diet: Clear liquids, advance to soft foods then regular diet as tolerated. ? ?Pain control:  ? 1)  Ibuprofen 600 mg by mouth every 6-8 hours as needed for pain ? 2)  Acetaminophen 325 one or two by mouth every 4-6 hours as needed for pain that is not resolved by ibuprofen; may alternate with ibuprofen every 2-3 hours for best results ? This regimen has been clinically studied and proven as effective against pain as morphine. ? ?Eye medications:   1. Antibiotic eye drops or ointment, one drop or application in the operated eye(s) 4 times a day for 7 days.   ?   2. Restasis:  stop for one week; restart on 11/07/21 ?   3. Muro eye drops: stop for two days; may restart on 11/04/21, if significant burning from unhealed wounds, may try to restart again on 11/06/21 ? ?Activity: No swimming for 1 week. It is OK to let water run over the face and eyes while showering or taking a bath, even during the first week.  No other restriction on exercise or activity. ? ?Eye movement: The eyes may look very slightly crossed in or turned out, and you may experience temporary diplopia. This is not unusual postoperatively and may happen up to two months after surgery while the muscles are healing. The eyes may be tired during the first few weeks after surgery; reading can be uncomfortable during the healing process but will not hurt the eyes. ? ?Call Dr. Eliane Decree office [phone] with any problems or concerns. ? ? ?No Tylenol until after 1:45pm today if needed ? ?Post Anesthesia Home Care Instructions ? ?Activity: ?Get plenty of rest for the remainder of the day. A responsible individual must stay with you for 24 hours following the procedure.  ?For the next 24 hours, DO NOT: ?-Drive a car ?-Advertising copywriter ?-Drink alcoholic beverages ?-Take any medication unless instructed by your physician ?-Make any legal decisions or sign important papers. ? ?Meals: ?Start with liquid foods such as gelatin or soup. Progress to regular foods as  tolerated. Avoid greasy, spicy, heavy foods. If nausea and/or vomiting occur, drink only clear liquids until the nausea and/or vomiting subsides. Call your physician if vomiting continues. ? ?Special Instructions/Symptoms: ?Your throat may feel dry or sore from the anesthesia or the breathing tube placed in your throat during surgery. If this causes discomfort, gargle with warm salt water. The discomfort should disappear within 24 hours. ? ?If you had a scopolamine patch placed behind your ear for the management of post- operative nausea and/or vomiting: ? ?1. The medication in the patch is effective for 72 hours, after which it should be removed.  Wrap patch in a tissue and discard in the trash. Wash hands thoroughly with soap and water. ?2. You may remove the patch earlier than 72 hours if you experience unpleasant side effects which may include dry mouth, dizziness or visual disturbances. ?3. Avoid touching the patch. Wash your hands with soap and water after contact with the patch. ?    ? ?

## 2021-11-01 NOTE — Anesthesia Postprocedure Evaluation (Signed)
Anesthesia Post Note ? ?Patient: Morgan Mcintyre ? ?Procedure(s) Performed: REPAIR STRABISMUS BILATERAL (Bilateral: Eye) ? ?  ? ?Patient location during evaluation: PACU ?Anesthesia Type: General ?Level of consciousness: awake and alert ?Pain management: pain level controlled ?Vital Signs Assessment: post-procedure vital signs reviewed and stable ?Respiratory status: spontaneous breathing, nonlabored ventilation and respiratory function stable ?Cardiovascular status: blood pressure returned to baseline and stable ?Postop Assessment: no apparent nausea or vomiting ?Anesthetic complications: no ? ? ?No notable events documented. ? ?Last Vitals:  ?Vitals:  ? 11/01/21 0934 11/01/21 0945  ?BP: 115/70 104/70  ?Pulse: 92 84  ?Resp: 17 14  ?Temp: 36.8 ?C   ?SpO2: 100% 100%  ?  ?Last Pain:  ?Vitals:  ? 11/01/21 1000  ?TempSrc:   ?PainSc: 0-No pain  ? ? ?  ?  ?  ?  ?  ?  ? ?Lucretia Kern ? ? ? ? ?

## 2021-11-01 NOTE — Interval H&P Note (Signed)
History and Physical Interval Note: ? ?11/01/2021 ?8:19 AM ? ?Morgan Mcintyre  has presented today for surgery, with the diagnosis of EXOTROPIA.  The various methods of treatment have been discussed with the patient and family. After consideration of risks, benefits and other options for treatment, the patient has consented to  Procedure(s): ?REPAIR STRABISMUS BILATERAL (Bilateral) as a surgical intervention.  The patient's history has been reviewed, patient examined, no change in status, stable for surgery.  I have reviewed the patient's chart and labs.  Questions were answered to the patient's satisfaction.   ? ? ?French Ana ? ? ?

## 2021-11-01 NOTE — Transfer of Care (Signed)
Immediate Anesthesia Transfer of Care Note ? ?Patient: Morgan Mcintyre ? ?Procedure(s) Performed: REPAIR STRABISMUS BILATERAL (Bilateral: Eye) ? ?Patient Location: PACU ? ?Anesthesia Type:General ? ?Level of Consciousness: drowsy, patient cooperative and responds to stimulation ? ?Airway & Oxygen Therapy: Patient Spontanous Breathing and Patient connected to face mask oxygen ? ?Post-op Assessment: Report given to RN and Post -op Vital signs reviewed and stable ? ?Post vital signs: Reviewed and stable ? ?Last Vitals:  ?Vitals Value Taken Time  ?BP    ?Temp    ?Pulse 92 11/01/21 0934  ?Resp 17 11/01/21 0934  ?SpO2 100 % 11/01/21 0934  ?Vitals shown include unvalidated device data. ? ?Last Pain:  ?Vitals:  ? 11/01/21 0721  ?TempSrc: Oral  ?PainSc: 0-No pain  ?   ? ?  ? ?Complications: No notable events documented. ?

## 2021-11-02 ENCOUNTER — Encounter (HOSPITAL_BASED_OUTPATIENT_CLINIC_OR_DEPARTMENT_OTHER): Payer: Self-pay | Admitting: Ophthalmology

## 2021-12-04 ENCOUNTER — Other Ambulatory Visit: Payer: Self-pay | Admitting: Family Medicine

## 2021-12-04 DIAGNOSIS — E7800 Pure hypercholesterolemia, unspecified: Secondary | ICD-10-CM

## 2022-01-02 ENCOUNTER — Ambulatory Visit
Admission: RE | Admit: 2022-01-02 | Discharge: 2022-01-02 | Disposition: A | Discharge status: Home / Self Care | Payer: No Typology Code available for payment source | Source: Ambulatory Visit | Attending: Family Medicine | Admitting: Family Medicine

## 2022-01-02 DIAGNOSIS — E7800 Pure hypercholesterolemia, unspecified: Secondary | ICD-10-CM

## 2022-08-05 IMAGING — CT CT CARDIAC CORONARY ARTERY CALCIUM SCORE
3 series · 12 of 20 positions shown, 14 images · non-contrast
Comparison: None Available.

CLINICAL DATA: Pure hypercholesterolemia

EXAM:
CT CARDIAC CORONARY ARTERY CALCIUM SCORE
TECHNIQUE: Non-contrast imaging through the heart was performed using
prospective ECG gating. Image post processing was performed on an
independent workstation, allowing for quantitative analysis of the
heart and coronary arteries. Note that this exam targets the heart
and the chest was not imaged in its entirety.

[Series 2: calcium scoring 2.00 qr36 bestdiast 71% hrt calciu · axial · 0.41mm/px · z∈[+1544,+1576]mm · 2 of 80 slices shown]
[im 16/80  vessel]
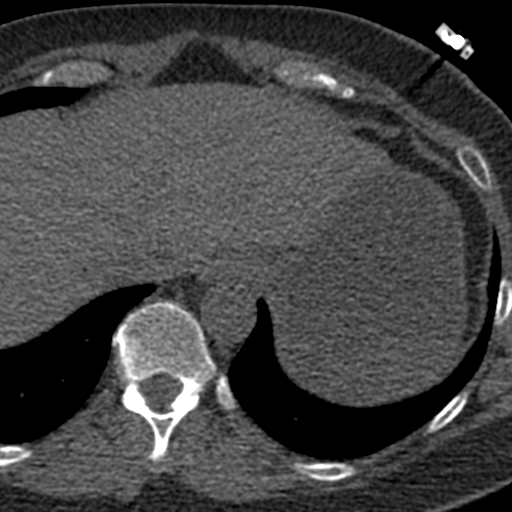
[im 32/80  vessel]
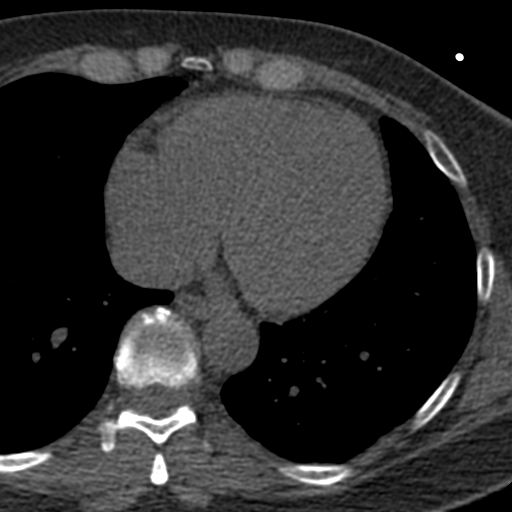

[Series 3: calcium scoring 2.00 br40 bestdiast 71% axial · axial · 0.50mm/px · z∈[+1540,+1644]mm · 5 of 80 slices shown, 7 images]
[im 14/80  vessel]
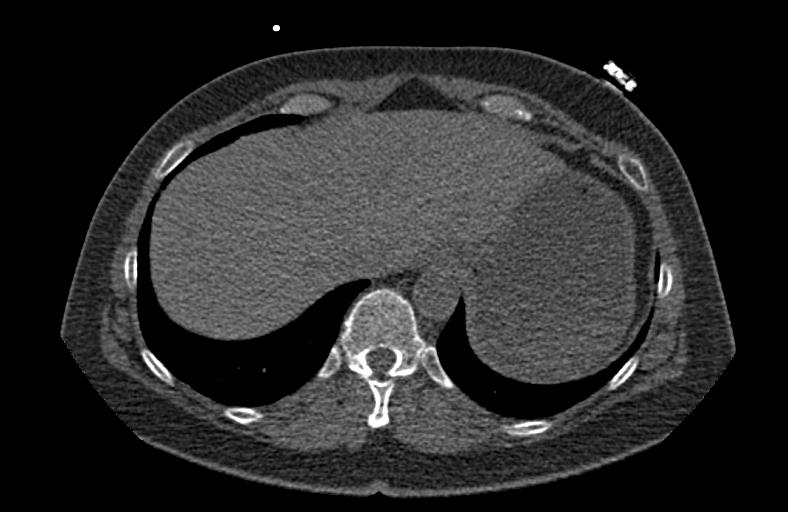
[im 14/80  lung]
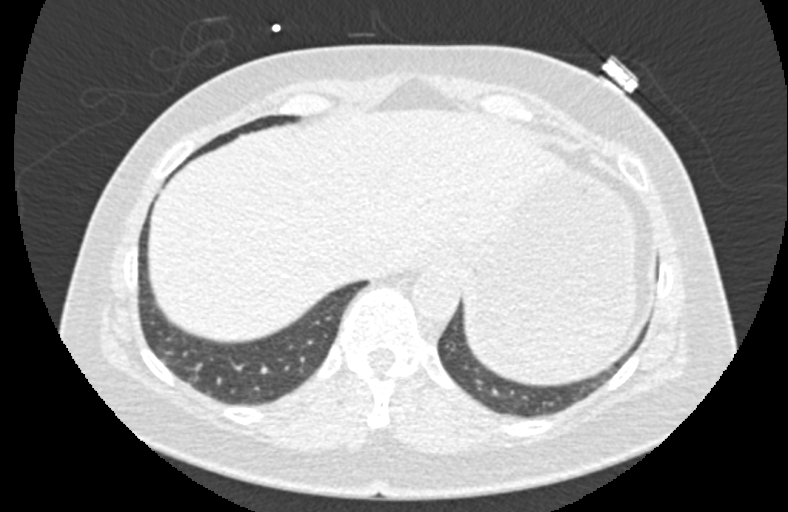
[im 27/80  vessel]
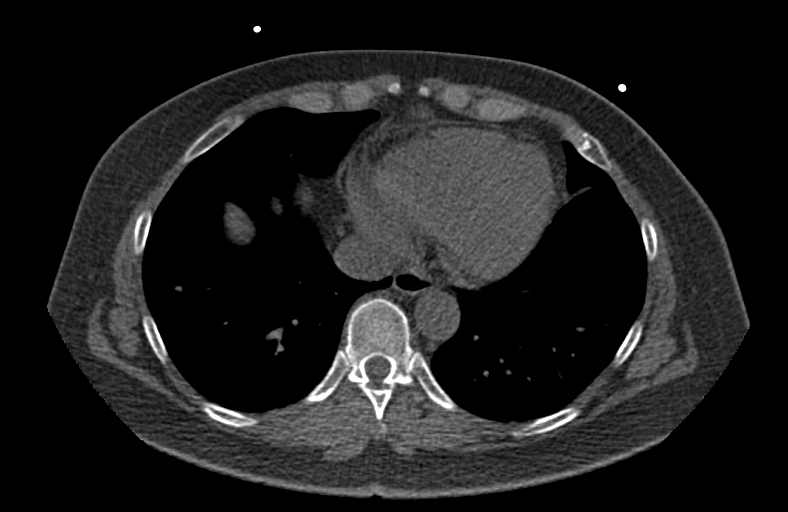
[im 40/80  vessel]
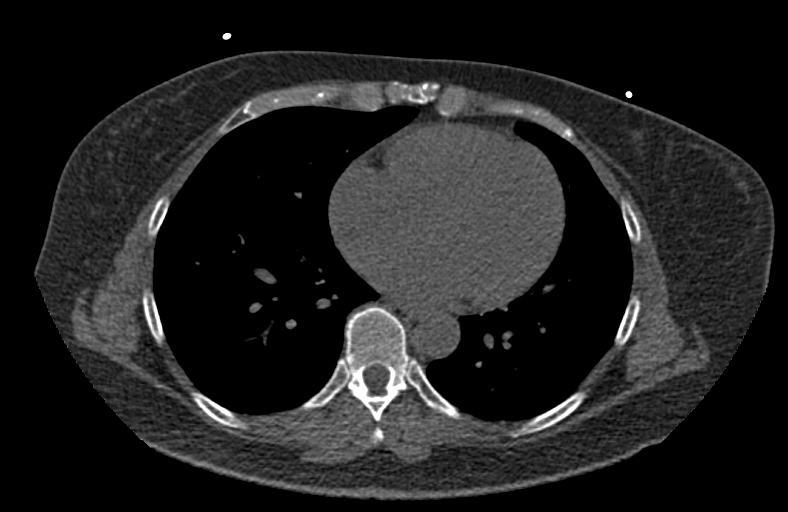
[im 53/80  vessel]
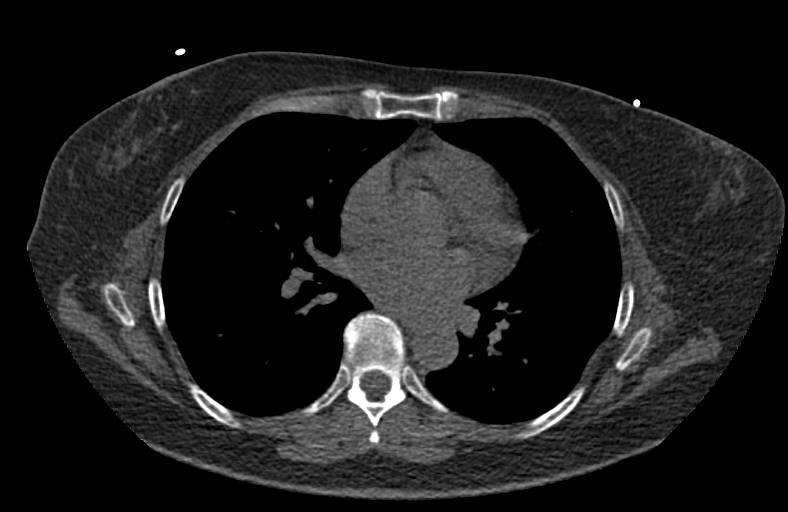
[im 66/80  vessel]
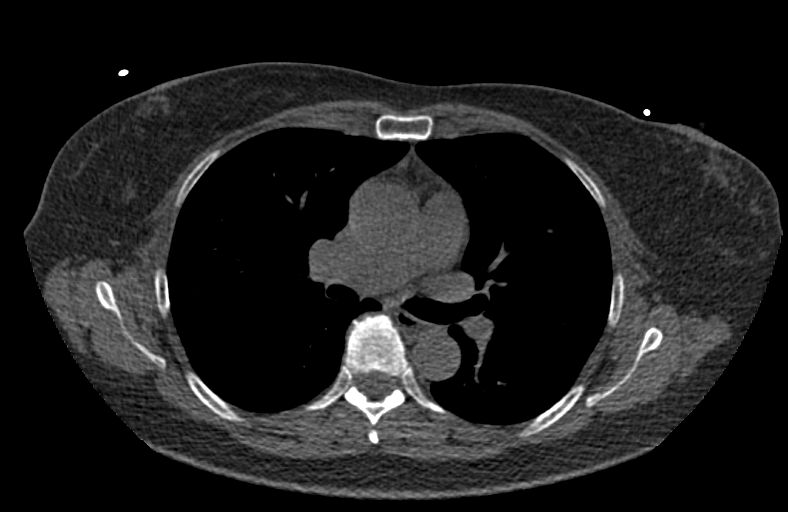
[im 66/80  lung]
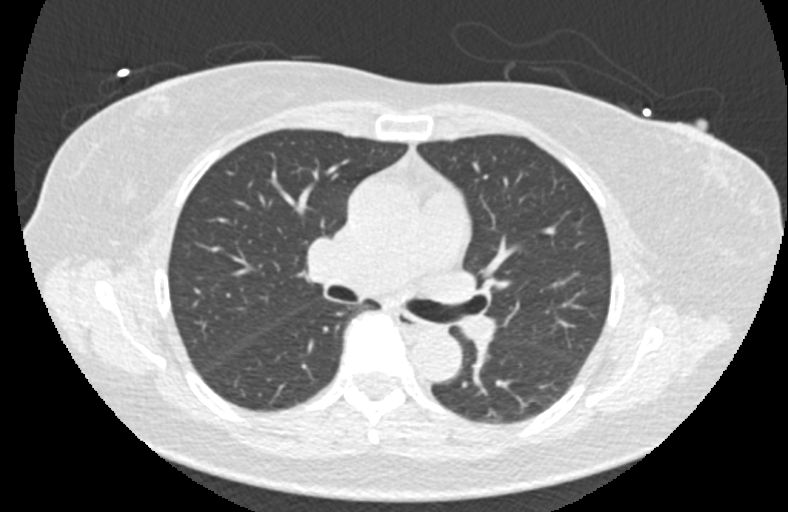

[Series 9: calcium scoring 2.00 br60 bestdiast 71% lungs · axial · 0.50mm/px · z∈[+1540,+1644]mm · 5 of 80 slices shown]
[im 14/80  vessel]
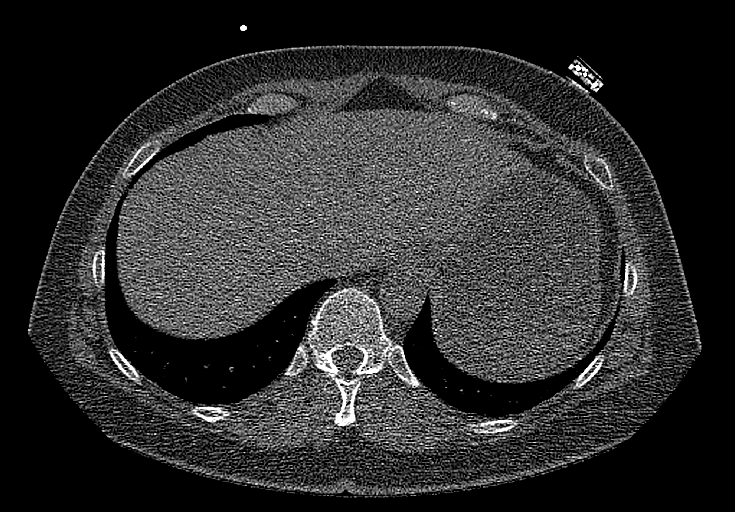
[im 27/80  vessel]
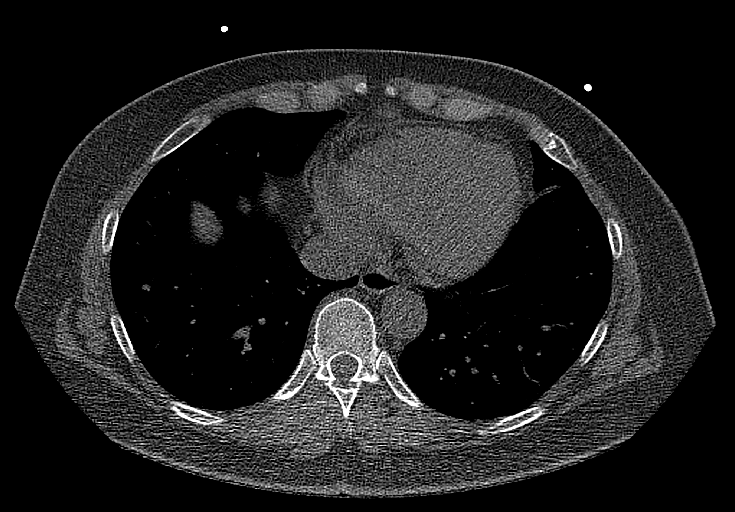
[im 40/80  vessel]
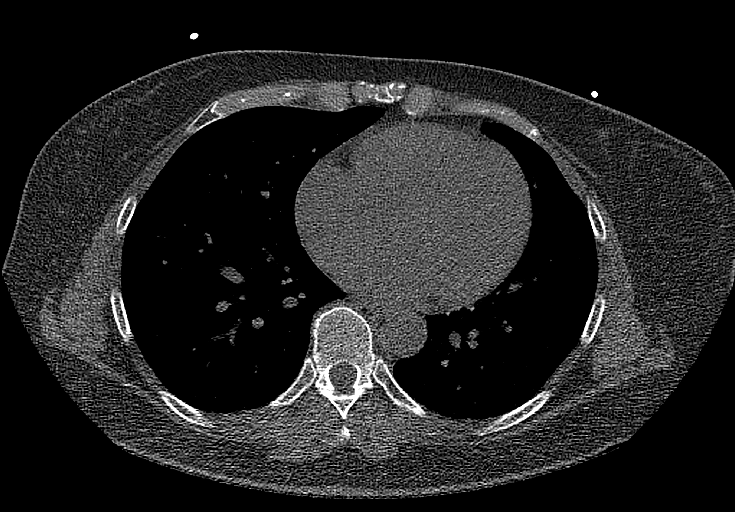
[im 53/80  vessel]
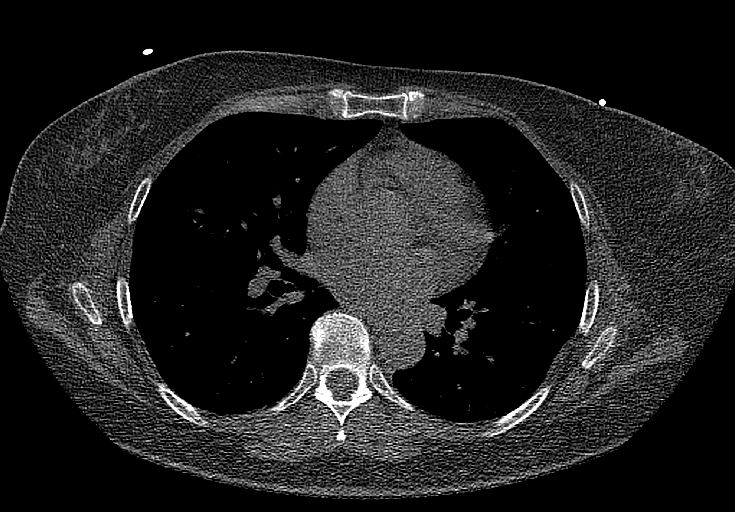
[im 66/80  vessel]
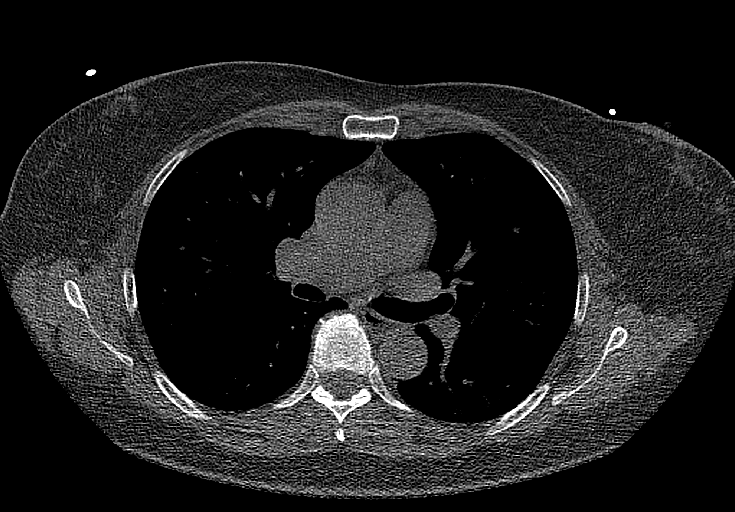

[12 of 20 positions shown; findings below may reference images not displayed]

FINDINGS: CORONARY CALCIUM SCORES:

Left Main: 0

LAD: 0

LCx: 0

RCA: 0

Total Agatston Score: 0

[HOSPITAL] percentile: 0

AORTA MEASUREMENTS:

Ascending Aorta: 33 mm

Descending Aorta: 23 mm

OTHER FINDINGS:

Heart is normal size. Aorta normal caliber. No adenopathy. No
confluent airspace opacities or effusions. No acute findings in the
upper abdomen. Chest wall soft tissues are unremarkable. No acute
bony abnormality.
IMPRESSION: No visible coronary artery calcifications. Total coronary calcium
score of 0.

No acute or significant extracardiac abnormality.

## 2023-05-05 ENCOUNTER — Other Ambulatory Visit (HOSPITAL_COMMUNITY): Payer: Self-pay | Admitting: Pain Medicine

## 2023-05-05 DIAGNOSIS — K5790 Diverticulosis of intestine, part unspecified, without perforation or abscess without bleeding: Secondary | ICD-10-CM

## 2023-05-07 ENCOUNTER — Ambulatory Visit (HOSPITAL_COMMUNITY)
Admission: RE | Admit: 2023-05-07 | Discharge: 2023-05-07 | Disposition: A | Discharge status: Home / Self Care | Payer: Commercial Managed Care - PPO | Source: Ambulatory Visit | Attending: Pain Medicine | Admitting: Pain Medicine

## 2023-05-07 DIAGNOSIS — K5790 Diverticulosis of intestine, part unspecified, without perforation or abscess without bleeding: Secondary | ICD-10-CM | POA: Diagnosis present

## 2023-05-07 MED ORDER — SODIUM CHLORIDE (PF) 0.9 % IJ SOLN
INTRAMUSCULAR | Status: AC
  Filled 2023-05-07: qty 50

## 2023-05-07 MED ORDER — IOHEXOL 300 MG/ML  SOLN
100.0000 mL | Freq: Once | INTRAMUSCULAR | Status: AC | PRN
  Administered 2023-05-07: 100 mL via INTRAVENOUS

## 2023-05-07 MED ORDER — IOHEXOL 9 MG/ML PO SOLN
1000.0000 mL | Freq: Once | ORAL | Status: AC
  Administered 2023-05-07: 1000 mL via ORAL

## 2023-05-08 ENCOUNTER — Ambulatory Visit (HOSPITAL_COMMUNITY): Payer: BLUE CROSS/BLUE SHIELD

## 2023-05-14 ENCOUNTER — Other Ambulatory Visit: Payer: Self-pay | Admitting: Pain Medicine

## 2023-05-14 DIAGNOSIS — K769 Liver disease, unspecified: Secondary | ICD-10-CM

## 2023-06-13 ENCOUNTER — Ambulatory Visit
Admission: RE | Admit: 2023-06-13 | Discharge: 2023-06-13 | Disposition: A | Discharge status: Home / Self Care | Payer: Commercial Managed Care - PPO | Source: Ambulatory Visit | Attending: Pain Medicine | Admitting: Pain Medicine

## 2023-06-13 DIAGNOSIS — K769 Liver disease, unspecified: Secondary | ICD-10-CM

## 2023-06-13 MED ORDER — GADOPICLENOL 0.5 MMOL/ML IV SOLN
8.0000 mL | Freq: Once | INTRAVENOUS | Status: AC | PRN
  Administered 2023-06-13: 8 mL via INTRAVENOUS
